# Patient Record
Sex: Male | Born: 1955 | Race: White | Hispanic: No | Marital: Married | Smoking: Never smoker
Health system: Southern US, Community
[De-identification: ages and names within clinical notes are randomized; demographics above are authoritative.]

## PROBLEM LIST (undated history)

## (undated) DIAGNOSIS — E119 Type 2 diabetes mellitus without complications: Secondary | ICD-10-CM

## (undated) DIAGNOSIS — I10 Essential (primary) hypertension: Secondary | ICD-10-CM

## (undated) DIAGNOSIS — I219 Acute myocardial infarction, unspecified: Secondary | ICD-10-CM

## (undated) HISTORY — PX: TONSILLECTOMY: SUR1361

---
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
## 1988-06-26 HISTORY — PX: BACK SURGERY: SHX140

## 1989-06-26 HISTORY — PX: BACK SURGERY: SHX140

## 1993-06-26 HISTORY — PX: BACK SURGERY: SHX140

## 1994-06-26 HISTORY — PX: KNEE ARTHROSCOPY: SUR90

## 1997-06-26 HISTORY — PX: CHOLECYSTECTOMY: SHX55

## 2008-08-26 HISTORY — PX: CORONARY ARTERY BYPASS GRAFT: SHX141

## 2008-09-19 DIAGNOSIS — I219 Acute myocardial infarction, unspecified: Secondary | ICD-10-CM

## 2008-09-19 HISTORY — DX: Acute myocardial infarction, unspecified: I21.9

## 2012-06-26 HISTORY — PX: FOOT ARTHROPLASTY: SHX1657

## 2017-02-06 NOTE — Patient Instructions (Addendum)
Stephen Gonzales  02/06/2017     @PREFPERIOPPHARMACY @   Your procedure is scheduled on  02/14/2017   Report to [facility] at  1100 A.M.  Call this number if you have problems the morning of surgery:  [phone]   Remember:  Do not eat food or drink liquids after midnight.  Take these medicines the morning of surgery with A SIP OF WATER  Norvasc, Coreg, Enalapril and Neurontin  Do not wear jewelry, make-up or nail polish.  Do not wear lotions, powders, or perfumes, or deoderant.  Do not shave 48 hours prior to surgery.  Men may shave face and neck.  Do not bring valuables to the hospital.  [facility] is not responsible for any belongings or valuables.  Contacts, dentures or bridgework may not be worn into surgery.  Leave your suitcase in the car.  After surgery it may be brought to your room.  For patients admitted to the hospital, discharge time will be determined by your treatment team.  Patients discharged the day of surgery will not be allowed to drive home.   Name and phone number of your driver:   family Special instructions:  None  Please read over the following fact sheets that you were given. Anesthesia Post-op Instructions and Care and Recovery After Surgery         Ostectomy of the Foot Ostectomy of the foot is a surgery to remove part of a bone in the foot. You may need this procedure if:  You have an abnormal bone growth called a bone spur (osteophyte) in your foot.  Bones in your foot are not lined up with each other (misalignment).  The procedure may be done if one of these conditions is causing pain or limiting movement. It is usually done when other treatments have not helped. During an ostectomy, part of the bone is removed to shorten or reshape the bone. Tell a health care provider about:  Any allergies you have.  All medicines you are taking, including vitamins, herbs, eye drops, creams, and over-the-counter medicines.  Any problems  you or family members have had with anesthetic medicines.  Any blood disorders you have.  Any surgeries you have had.  Any medical conditions you have.  Whether you are pregnant or may be pregnant. What are the risks? Generally, this is a safe procedure. However, problems may occur, including:  Infection.  Bleeding.  Pain.  Stiffness.  Numbness.  Allergic reactions to medicines.  Damage to nerves or blood vessels in the foot.  A blood clot that forms in the leg and travels to the lung.  Failure of the bone to heal.  The abnormal bone growth coming back again (recurrence).  What happens before the procedure? Staying hydrated Follow instructions from your health care provider about hydration, which may include:  Up to 2 hours before the procedure - you may continue to drink clear liquids, such as water, clear fruit juice, black coffee, and plain tea.  Eating and drinking restrictions Follow instructions from your health care provider about eating and drinking, which may include:  8 hours before the procedure - stop eating heavy meals or foods such as meat, fried foods, or fatty foods.  6 hours before the procedure - stop eating light meals or foods, such as toast or cereal.  6 hours before the procedure - stop drinking milk or drinks that contain milk.  2 hours before the procedure - stop drinking  clear liquids.  Medicines  Ask your health care provider about: ? Changing or stopping your regular medicines. This is especially important if you are taking diabetes medicines or blood thinners. ? Taking medicines such as aspirin and ibuprofen. These medicines can thin your blood. Do not take these medicines before your procedure if your health care provider instructs you not to.  You may be given antibiotic medicine to help prevent infection. General instructions  You may have foot X-rays done while you are standing on your foot. This allows the surgeon to clearly  see the abnormal bone growth in your foot.  Ask your health care provider how your surgical site will be marked or identified.  You may be asked to shower with a germ-killing soap.  Plan to have a responsible adult care for you for at least 24 hours after you leave the hospital or clinic. This is important. What happens during the procedure?  To lower your risk of infection: ? Your health care team will wash or sanitize their hands. ? Your skin will be washed with soap. ? Hair may be removed from the surgical area.  You will be given one or more of the following: ? A medicine to help you relax (sedative). ? A medicine to numb the area (local anesthetic). ? A medicine to make you fall asleep (general anesthetic). ? A medicine that is injected into your spine to numb the area below and slightly above the injection site (spinal anesthetic). ? A medicine that is injected into an area of your body to numb everything below the injection site (regional anesthetic).  An incision will be made in your foot.  Tissues, nerves, and blood vessels near the bone will be moved out of the way.  The spur or the part of bone that needs to be removed will be cut away using a bone saw or a bone shaving instrument.  The remaining bone may be secured with screws, wires, or plates.  The skin incision will be closed with stitches (sutures) or another type of skin closure.  A bandage (dressing) will be placed over the incision.  A soft wrap may be placed around your foot. The procedure may vary among health care providers and hospitals. What happens after the procedure?  Your blood pressure, heart rate, breathing rate, and blood oxygen level will be monitored until the medicines you were given have worn off.  Your foot may be placed in a protective shoe, a splint, or a cast.  You may be given crutches or a walker to help you walk without putting weight (bearing weight) on your foot.  Do not drive for  24 hours if you were given a sedative. Summary  Ostectomy of the foot is a surgery to remove part of a bone in the foot.  You may need this procedure if you have an abnormal bone growth called a bone spur (osteophyte) or if bones in your foot are not lined up with each other (misalignment). The procedure may be done if you have pain or limited movement and other treatments have not helped.  Follow instructions from your health care provider about eating and drinking before the procedure.  After the procedure, your foot may be placed in a protective shoe, a splint, or a cast. You may be given crutches or a walker to help you walk without putting weight (bearing weight) on your foot. This information is not intended to replace advice given to you by your health care  provider. Make sure you discuss any questions you have with your health care provider. Document Released: 08/01/2016 Document Revised: 08/01/2016 Document Reviewed: 08/01/2016 Elsevier Interactive Patient Education  2018 [employer].  [place] of the Foot, Care After This sheet gives you information about how to care for yourself after your procedure. Your health care provider may also give you more specific instructions. If you have problems or questions, contact your health care provider. What can I expect after the procedure? After the procedure, it is common to have:  Pain.  Swelling.  Stiffness.  Follow these instructions at home: If you have a splint or supportive shoe:  Wear the splint or shoe as told by your health care provider. Remove it only as told by your health care provider.  Loosen the splint or shoe if your toes tingle, become numb, or turn cold and blue.  Keep the splint or shoe clean.  If the splint or shoe is not waterproof: ? Do not let it get wet. ? Cover it with a watertight covering when you take a bath or a shower. If you have a cast:  Do not stick anything inside the cast to scratch your  skin. Doing that increases your risk of infection.  Check the skin around the cast every day. Tell your health care provider about any concerns.  You may put lotion on dry skin around the edges of the cast. Do not put lotion on the skin underneath the cast.  Keep the cast clean.  If the cast is not waterproof: ? Do not let it get wet. ? Cover it with a watertight covering when you take a bath or a shower. Bathing  Do not take baths, swim, or use a hot tub until your health care provider approves. Ask your health care provider if you may take showers. You may only be allowed to take sponge baths for bathing.  If your cast or splint is not waterproof, cover it with a watertight covering when you take a bath or a shower.  Keep the bandage (dressing) dry until your health care provider says it can be removed. Incision care  Follow instructions from your health care provider about how to take care of your incision. Make sure you: ? Wash your hands with soap and water before you change your bandage (dressing). If soap and water are not available, use hand sanitizer. ? Change your dressing as told by your health care provider. ? Leave stitches (sutures), skin glue, or adhesive strips in place. These skin closures may need to stay in place for 2 weeks or longer. If adhesive strip edges start to loosen and curl up, you may trim the loose edges. Do not remove adhesive strips completely unless your health care provider tells you to do that.  Check your incision area every day for signs of infection. Check for: ? Redness, swelling, or pain. ? Fluid or blood. ? Warmth. ? Pus or a bad smell. Managing pain, stiffness, and swelling  If directed, put ice on the affected area. ? If you have a removable splint or shoe, remove it as told by your health care provider. ? Put ice in a plastic bag. ? Place a towel between your skin and the bag or between your cast and the bag. ? Leave the ice on for 20  minutes, 2-3 times a day.  Move your foot and toes often to avoid stiffness and to lessen swelling.  Raise (elevate) your foot area above the level  of your heart while you are sitting or lying down. Driving  Do not drive or use heavy machinery while taking prescription pain medicine.  Ask your health care provider when it is safe to drive if you have a cast, splint, or supportive shoe on your foot. Activity  Return to your normal activities as told by your health care provider. Ask your health care provider what activities are safe for you.  Do exercises as told by your health care provider. Safety  Do not use your foot to support your body weight until your health care provider says that you can.  Use your crutches or walker as told by your health care provider. General instructions  Do not put pressure on any part of the cast or splint until it is fully hardened. This may take several hours.  Do not use any products that contain nicotine or tobacco, such as cigarettes and e-cigarettes. These can delay bone healing. If you need help quitting, ask your health care provider.  Take over-the-counter and prescription medicines only as told by your health care provider.  Keep all follow-up visits as told by your health care provider. This is important. Contact a health care provider if:  You have chills or a fever.  Your pain is not controlled by your pain medicine.  You have redness, swelling, or pain around your incision.  You have fluid or blood coming from your incision.  Your incision feels warm to the touch.  You have pus or a bad smell coming from your incision. Get help right away if:  You have severe pain.  You have new pain, warmth, and swelling in your leg.  You have chest pain or difficulty breathing. Summary  After the procedure, it is common to have some pain, swelling, and stiffness.  Follow instructions from your health care provider about how to take  care of your incision. Check the incision area every day for signs of infection.  Do not use your foot to support your body weight until your health care provider says that you can.  Move your foot and toes often to avoid stiffness and to lessen swelling. This information is not intended to replace advice given to you by your health care provider. Make sure you discuss any questions you have with your health care provider. Document Released: 08/01/2016 Document Revised: 08/01/2016 Document Reviewed: 08/01/2016 Elsevier Interactive Patient Education  2018 [employer]      Monitored Anesthesia Care Anesthesia is a term that refers to techniques, procedures, and medicines that help a person stay safe and comfortable during a medical procedure. Monitored anesthesia care, or sedation, is one type of anesthesia. Your anesthesia specialist may recommend sedation if you will be having a procedure that does not require you to be unconscious, such as:  Cataract surgery.  A dental procedure.  A biopsy.  A colonoscopy.  During the procedure, you may receive a medicine to help you relax (sedative). There are three levels of sedation:  Mild sedation. At this level, you may feel awake and relaxed. You will be able to follow directions.  Moderate sedation. At this level, you will be sleepy. You may not remember the procedure.  Deep sedation. At this level, you will be asleep. You will not remember the procedure.  The more medicine you are given, the deeper your level of sedation will be. Depending on how you respond to the procedure, the anesthesia specialist may change your level of sedation or the type of  anesthesia to fit your needs. An anesthesia specialist will monitor you closely during the procedure. Let your health care provider know about:  Any allergies you have.  All medicines you are taking, including vitamins, herbs, eye drops, creams, and over-the-counter medicines.  Any use of  steroids (by mouth or as a cream).  Any problems you or family members have had with sedatives and anesthetic medicines.  Any blood disorders you have.  Any surgeries you have had.  Any medical conditions you have, such as sleep apnea.  Whether you are pregnant or may be pregnant.  Any use of cigarettes, alcohol, or street drugs. What are the risks? Generally, this is a safe procedure. However, problems may occur, including:  Getting too much medicine (oversedation).  Nausea.  Allergic reaction to medicines.  Trouble breathing. If this happens, a breathing tube may be used to help with breathing. It will be removed when you are awake and breathing on your own.  Heart trouble.  Lung trouble.  Before the procedure Staying hydrated Follow instructions from your health care provider about hydration, which may include:  Up to 2 hours before the procedure - you may continue to drink clear liquids, such as water, clear fruit juice, black coffee, and plain tea.  Eating and drinking restrictions Follow instructions from your health care provider about eating and drinking, which may include:  8 hours before the procedure - stop eating heavy meals or foods such as meat, fried foods, or fatty foods.  6 hours before the procedure - stop eating light meals or foods, such as toast or cereal.  6 hours before the procedure - stop drinking milk or drinks that contain milk.  2 hours before the procedure - stop drinking clear liquids.  Medicines Ask your health care provider about:  Changing or stopping your regular medicines. This is especially important if you are taking diabetes medicines or blood thinners.  Taking medicines such as aspirin and ibuprofen. These medicines can thin your blood. Do not take these medicines before your procedure if your health care provider instructs you not to.  Tests and exams  You will have a physical exam.  You may have blood tests done to  show: ? How well your kidneys and liver are working. ? How well your blood can clot.  General instructions  Plan to have someone take you home from the hospital or clinic.  If you will be going home right after the procedure, plan to have someone with you for 24 hours.  What happens during the procedure?  Your blood pressure, heart rate, breathing, level of pain and overall condition will be monitored.  An IV tube will be inserted into one of your veins.  Your anesthesia specialist will give you medicines as needed to keep you comfortable during the procedure. This may mean changing the level of sedation.  The procedure will be performed. After the procedure  Your blood pressure, heart rate, breathing rate, and blood oxygen level will be monitored until the medicines you were given have worn off.  Do not drive for 24 hours if you received a sedative.  You may: ? Feel sleepy, clumsy, or nauseous. ? Feel forgetful about what happened after the procedure. ? Have a sore throat if you had a breathing tube during the procedure. ? Vomit. This information is not intended to replace advice given to you by your health care provider. Make sure you discuss any questions you have with your health care provider. Document  Released: 03/08/2005 Document Revised: 11/19/2015 Document Reviewed: 10/03/2015 Elsevier Interactive Patient Education  2018 [employer]  Monitored Anesthesia Care, Care After These instructions provide you with information about caring for yourself after your procedure. Your health care provider may also give you more specific instructions. Your treatment has been planned according to current medical practices, but problems sometimes occur. Call your health care provider if you have any problems or questions after your procedure. What can I expect after the procedure? After your procedure, it is common to:  Feel sleepy for several hours.  Feel clumsy and have poor  balance for several hours.  Feel forgetful about what happened after the procedure.  Have poor judgment for several hours.  Feel nauseous or vomit.  Have a sore throat if you had a breathing tube during the procedure.  Follow these instructions at home: For at least 24 hours after the procedure:   Do not: ? Participate in activities in which you could fall or become injured. ? Drive. ? Use heavy machinery. ? Drink alcohol. ? Take sleeping pills or medicines that cause drowsiness. ? Make important decisions or sign legal documents. ? Take care of children on your own.  Rest. Eating and drinking  Follow the diet that is recommended by your health care provider.  If you vomit, drink water, juice, or soup when you can drink without vomiting.  Make sure you have little or no nausea before eating solid foods. General instructions  Have a responsible adult stay with you until you are awake and alert.  Take over-the-counter and prescription medicines only as told by your health care provider.  If you smoke, do not smoke without supervision.  Keep all follow-up visits as told by your health care provider. This is important. Contact a health care provider if:  You keep feeling nauseous or you keep vomiting.  You feel light-headed.  You develop a rash.  You have a fever. Get help right away if:  You have trouble breathing. This information is not intended to replace advice given to you by your health care provider. Make sure you discuss any questions you have with your health care provider. Document Released: 10/03/2015 Document Revised: 02/02/2016 Document Reviewed: 10/03/2015 Elsevier Interactive Patient Education  2018 [employer].  General Anesthesia, Adult General anesthesia is the use of medicines to make a person "go to sleep" (be unconscious) for a medical procedure. General anesthesia is often recommended when a procedure:  Is long.  Requires you to be  still or in an unusual position.  Is major and can cause you to lose blood.  Is impossible to do without general anesthesia.  The medicines used for general anesthesia are called general anesthetics. In addition to making you sleep, the medicines:  Prevent pain.  Control your blood pressure.  Relax your muscles.  Tell a health care provider about:  Any allergies you have.  All medicines you are taking, including vitamins, herbs, eye drops, creams, and over-the-counter medicines.  Any problems you or family members have had with anesthetic medicines.  Types of anesthetics you have had in the past.  Any bleeding disorders you have.  Any surgeries you have had.  Any medical conditions you have.  Any history of heart or lung conditions, such as heart failure, sleep apnea, or chronic obstructive pulmonary disease (COPD).  Whether you are pregnant or may be pregnant.  Whether you use tobacco, alcohol, marijuana, or street drugs.  Any history of Financial [employer].  Any history  of depression or anxiety. What are the risks? Generally, this is a safe procedure. However, problems may occur, including:  Allergic reaction to anesthetics.  Lung and heart problems.  Inhaling food or liquids from your stomach into your lungs (aspiration).  Injury to nerves.  Waking up during your procedure and being unable to move (rare).  Extreme agitation or a state of mental confusion (delirium) when you wake up from the anesthetic.  Air in the bloodstream, which can lead to stroke.  These problems are more likely to develop if you are having a major surgery or if you have an advanced medical condition. You can prevent some of these complications by answering all of your health care provider's questions thoroughly and by following all pre-procedure instructions. General anesthesia can cause side effects, including:  Nausea or vomiting  A sore throat from the breathing tube.  Feeling  cold or shivery.  Feeling tired, washed out, or achy.  Sleepiness or drowsiness.  Confusion or agitation.  What happens before the procedure? Staying hydrated Follow instructions from your health care provider about hydration, which may include:  Up to 2 hours before the procedure - you may continue to drink clear liquids, such as water, clear fruit juice, black coffee, and plain tea.  Eating and drinking restrictions Follow instructions from your health care provider about eating and drinking, which may include:  8 hours before the procedure - stop eating heavy meals or foods such as meat, fried foods, or fatty foods.  6 hours before the procedure - stop eating light meals or foods, such as toast or cereal.  6 hours before the procedure - stop drinking milk or drinks that contain milk.  2 hours before the procedure - stop drinking clear liquids.  Medicines  Ask your health care provider about: ? Changing or stopping your regular medicines. This is especially important if you are taking diabetes medicines or blood thinners. ? Taking medicines such as aspirin and ibuprofen. These medicines can thin your blood. Do not take these medicines before your procedure if your health care provider instructs you not to. ? Taking new dietary supplements or medicines. Do not take these during the week before your procedure unless your health care provider approves them.  If you are told to take a medicine or to continue taking a medicine on the day of the procedure, take the medicine with sips of water. General instructions   Ask if you will be going home the same day, the following day, or after a longer hospital stay. ? Plan to have someone take you home. ? Plan to have someone stay with you for the first 24 hours after you leave the hospital or clinic.  For 3-6 weeks before the procedure, try not to use any tobacco products, such as cigarettes, chewing tobacco, and e-cigarettes.  You  may brush your teeth on the morning of the procedure, but make sure to spit out the toothpaste. What happens during the procedure?  You will be given anesthetics through a mask and through an IV tube in one of your veins.  You may receive medicine to help you relax (sedative).  As soon as you are asleep, a breathing tube may be used to help you breathe.  An anesthesia specialist will stay with you throughout the procedure. He or she will help keep you comfortable and safe by continuing to give you medicines and adjusting the amount of medicine that you get. He or she will also watch your  blood pressure, pulse, and oxygen levels to make sure that the anesthetics do not cause any problems.  If a breathing tube was used to help you breathe, it will be removed before you wake up. The procedure may vary among health care providers and hospitals. What happens after the procedure?  You will wake up, often slowly, after the procedure is complete, usually in a recovery area.  Your blood pressure, heart rate, breathing rate, and blood oxygen level will be monitored until the medicines you were given have worn off.  You may be given medicine to help you calm down if you feel anxious or agitated.  If you will be going home the same day, your health care provider may check to make sure you can stand, drink, and urinate.  Your health care providers will treat your pain and side effects before you go home.  Do not drive for 24 hours if you received a sedative.  You may: ? Feel nauseous and vomit. ? Have a sore throat. ? Have mental slowness. ? Feel cold or shivery. ? Feel sleepy. ? Feel tired. ? Feel sore or achy, even in parts of your body where you did not have surgery. This information is not intended to replace advice given to you by your health care provider. Make sure you discuss any questions you have with your health care provider. Document Released: 09/19/2007 Document Revised:  11/23/2015 Document Reviewed: 05/27/2015 Elsevier Interactive Patient Education  2018 [employer]. General Anesthesia, Adult, Care After These instructions provide you with information about caring for yourself after your procedure. Your health care provider may also give you more specific instructions. Your treatment has been planned according to current medical practices, but problems sometimes occur. Call your health care provider if you have any problems or questions after your procedure. What can I expect after the procedure? After the procedure, it is common to have:  Vomiting.  A sore throat.  Mental slowness.  It is common to feel:  Nauseous.  Cold or shivery.  Sleepy.  Tired.  Sore or achy, even in parts of your body where you did not have surgery.  Follow these instructions at home: For at least 24 hours after the procedure:  Do not: ? Participate in activities where you could fall or become injured. ? Drive. ? Use heavy machinery. ? Drink alcohol. ? Take sleeping pills or medicines that cause drowsiness. ? Make important decisions or sign legal documents. ? Take care of children on your own.  Rest. Eating and drinking  If you vomit, drink water, juice, or soup when you can drink without vomiting.  Drink enough fluid to keep your urine clear or pale yellow.  Make sure you have little or no nausea before eating solid foods.  Follow the diet recommended by your health care provider. General instructions  Have a responsible adult stay with you until you are awake and alert.  Return to your normal activities as told by your health care provider. Ask your health care provider what activities are safe for you.  Take over-the-counter and prescription medicines only as told by your health care provider.  If you smoke, do not smoke without supervision.  Keep all follow-up visits as told by your health care provider. This is important. Contact a health care  provider if:  You continue to have nausea or vomiting at home, and medicines are not helpful.  You cannot drink fluids or start eating again.  You cannot urinate after 8-12  hours.  You develop a skin rash.  You have fever.  You have increasing redness at the site of your procedure. Get help right away if:  You have difficulty breathing.  You have chest pain.  You have unexpected bleeding.  You feel that you are having a life-threatening or urgent problem. This information is not intended to replace advice given to you by your health care provider. Make sure you discuss any questions you have with your health care provider. Document Released: 09/18/2000 Document Revised: 11/15/2015 Document Reviewed: 05/27/2015 Elsevier Interactive Patient Education  [employer].

## 2017-02-07 ENCOUNTER — Other Ambulatory Visit: Payer: Self-pay | Admitting: Podiatry

## 2017-02-09 ENCOUNTER — Other Ambulatory Visit (HOSPITAL_COMMUNITY): Payer: Self-pay | Admitting: Podiatry

## 2017-02-09 ENCOUNTER — Ambulatory Visit (HOSPITAL_COMMUNITY)
Admission: RE | Admit: 2017-02-09 | Discharge: 2017-02-09 | Disposition: A | Discharge status: Home / Self Care | Payer: Medicare HMO | Source: Ambulatory Visit | Attending: Podiatry | Admitting: Podiatry

## 2017-02-09 ENCOUNTER — Encounter (HOSPITAL_COMMUNITY)
Admission: RE | Admit: 2017-02-09 | Discharge: 2017-02-09 | Disposition: A | Discharge status: Home / Self Care | Payer: Medicare HMO | Source: Ambulatory Visit | Attending: Podiatry | Admitting: Podiatry

## 2017-02-09 ENCOUNTER — Encounter (HOSPITAL_COMMUNITY): Payer: Self-pay

## 2017-02-09 DIAGNOSIS — L97522 Non-pressure chronic ulcer of other part of left foot with fat layer exposed: Secondary | ICD-10-CM

## 2017-02-09 DIAGNOSIS — I4510 Unspecified right bundle-branch block: Secondary | ICD-10-CM | POA: Insufficient documentation

## 2017-02-09 DIAGNOSIS — Z01818 Encounter for other preprocedural examination: Secondary | ICD-10-CM | POA: Diagnosis present

## 2017-02-09 DIAGNOSIS — M19072 Primary osteoarthritis, left ankle and foot: Secondary | ICD-10-CM | POA: Insufficient documentation

## 2017-02-09 DIAGNOSIS — R9431 Abnormal electrocardiogram [ECG] [EKG]: Secondary | ICD-10-CM | POA: Diagnosis not present

## 2017-02-09 DIAGNOSIS — M25475 Effusion, left foot: Secondary | ICD-10-CM | POA: Insufficient documentation

## 2017-02-09 DIAGNOSIS — Z9889 Other specified postprocedural states: Secondary | ICD-10-CM | POA: Insufficient documentation

## 2017-02-09 HISTORY — DX: Acute myocardial infarction, unspecified: I21.9

## 2017-02-09 HISTORY — DX: Type 2 diabetes mellitus without complications: E11.9

## 2017-02-09 HISTORY — DX: Essential (primary) hypertension: I10

## 2017-02-09 LAB — BASIC METABOLIC PANEL
ANION GAP: 12 (ref 5–15)
BUN: 27 mg/dL — ABNORMAL HIGH (ref 6–20)
CALCIUM: 9.4 mg/dL (ref 8.9–10.3)
CO2: 24 mmol/L (ref 22–32)
Chloride: 108 mmol/L (ref 101–111)
Creatinine, Ser: 2.06 mg/dL — ABNORMAL HIGH (ref 0.61–1.24)
GFR calc Af Amer: 38 mL/min — ABNORMAL LOW (ref >60)
GFR calc non Af Amer: 33 mL/min — ABNORMAL LOW (ref >60)
GLUCOSE: 174 mg/dL — AB (ref 65–99)
POTASSIUM: 3.7 mmol/L (ref 3.5–5.1)
SODIUM: 144 mmol/L (ref 135–145)

## 2017-02-09 LAB — CBC WITH DIFFERENTIAL/PLATELET
Basophils Absolute: 0 10*3/uL (ref 0.0–0.1)
Basophils Relative: 0 %
EOS PCT: 2 %
Eosinophils Absolute: 0.2 10*3/uL (ref 0.0–0.7)
HEMATOCRIT: 33.1 % — AB (ref 39.0–52.0)
HEMOGLOBIN: 11.3 g/dL — AB (ref 13.0–17.0)
LYMPHS ABS: 2.2 10*3/uL (ref 0.7–4.0)
LYMPHS PCT: 21 %
MCH: 29 pg (ref 26.0–34.0)
MCHC: 34.1 g/dL (ref 30.0–36.0)
MCV: 85.1 fL (ref 78.0–100.0)
Monocytes Absolute: 0.7 10*3/uL (ref 0.1–1.0)
Monocytes Relative: 6 %
NEUTROS ABS: 7.5 10*3/uL (ref 1.7–7.7)
Neutrophils Relative %: 71 %
PLATELETS: 256 10*3/uL (ref 150–400)
RBC: 3.89 MIL/uL — AB (ref 4.22–5.81)
RDW: 13.7 % (ref 11.5–15.5)
WBC: 10.6 10*3/uL — AB (ref 4.0–10.5)

## 2017-02-09 LAB — SURGICAL PCR SCREEN
MRSA, PCR: NEGATIVE
Staphylococcus aureus: NEGATIVE

## 2017-02-09 NOTE — Progress Notes (Signed)
Decrease Aspirin 325 mg dosage to 81 mg today.  Increase Aspirin back to 325 mg the day after the procedure per order Hedy Camara Nurse Practitioner/Gary Hyacinth Meeker MD # Royston Bake. Dr. Nolen Mu notified of change.

## 2017-02-14 ENCOUNTER — Encounter (HOSPITAL_COMMUNITY): Payer: Self-pay

## 2017-02-14 ENCOUNTER — Ambulatory Visit (HOSPITAL_COMMUNITY): Payer: Medicare HMO | Admitting: Anesthesiology

## 2017-02-14 ENCOUNTER — Encounter (HOSPITAL_COMMUNITY)
Admission: RE | Disposition: A | Discharge status: Home / Self Care | Payer: Self-pay | Source: Ambulatory Visit | Attending: Podiatry

## 2017-02-14 ENCOUNTER — Ambulatory Visit (HOSPITAL_COMMUNITY): Payer: Medicare HMO

## 2017-02-14 ENCOUNTER — Ambulatory Visit (HOSPITAL_COMMUNITY)
Admission: RE | Admit: 2017-02-14 | Discharge: 2017-02-14 | Disposition: A | Discharge status: Home / Self Care | Payer: Medicare HMO | Source: Ambulatory Visit | Attending: Podiatry | Admitting: Podiatry

## 2017-02-14 DIAGNOSIS — I252 Old myocardial infarction: Secondary | ICD-10-CM | POA: Diagnosis not present

## 2017-02-14 DIAGNOSIS — E11621 Type 2 diabetes mellitus with foot ulcer: Secondary | ICD-10-CM | POA: Insufficient documentation

## 2017-02-14 DIAGNOSIS — I10 Essential (primary) hypertension: Secondary | ICD-10-CM | POA: Diagnosis not present

## 2017-02-14 DIAGNOSIS — E1142 Type 2 diabetes mellitus with diabetic polyneuropathy: Secondary | ICD-10-CM | POA: Diagnosis not present

## 2017-02-14 DIAGNOSIS — Z951 Presence of aortocoronary bypass graft: Secondary | ICD-10-CM | POA: Insufficient documentation

## 2017-02-14 DIAGNOSIS — L97529 Non-pressure chronic ulcer of other part of left foot with unspecified severity: Secondary | ICD-10-CM | POA: Diagnosis not present

## 2017-02-14 DIAGNOSIS — D649 Anemia, unspecified: Secondary | ICD-10-CM | POA: Insufficient documentation

## 2017-02-14 DIAGNOSIS — G8929 Other chronic pain: Secondary | ICD-10-CM | POA: Insufficient documentation

## 2017-02-14 DIAGNOSIS — Z9889 Other specified postprocedural states: Secondary | ICD-10-CM

## 2017-02-14 DIAGNOSIS — L97522 Non-pressure chronic ulcer of other part of left foot with fat layer exposed: Secondary | ICD-10-CM

## 2017-02-14 HISTORY — PX: METATARSAL HEAD EXCISION: SHX5027

## 2017-02-14 LAB — GLUCOSE, CAPILLARY
GLUCOSE-CAPILLARY: 130 mg/dL — AB (ref 65–99)
GLUCOSE-CAPILLARY: 160 mg/dL — AB (ref 65–99)

## 2017-02-14 SURGERY — EXCISION, METATARSAL BONE, HEAD
Anesthesia: Monitor Anesthesia Care | Site: Fifth Toe | Laterality: Left

## 2017-02-14 MED ORDER — CHLORHEXIDINE GLUCONATE CLOTH 2 % EX PADS: 6.0000 | MEDICATED_PAD | Freq: Once | CUTANEOUS | Status: DC

## 2017-02-14 MED ORDER — BUPIVACAINE HCL (PF) 0.5 % IJ SOLN
INTRAMUSCULAR | Status: DC | PRN
Start: 2017-02-14 — End: 2017-02-14
  Administered 2017-02-14: 20 mL

## 2017-02-14 MED ORDER — CEFAZOLIN SODIUM-DEXTROSE 2-3 GM-% IV SOLR
INTRAVENOUS | Status: DC | PRN
  Administered 2017-02-14: 2 g via INTRAVENOUS

## 2017-02-14 MED ORDER — MIDAZOLAM HCL 2 MG/2ML IJ SOLN
1.0000 mg | Freq: Once | INTRAMUSCULAR | Status: AC | PRN
  Administered 2017-02-14: 2 mg via INTRAVENOUS

## 2017-02-14 MED ORDER — BUPIVACAINE HCL (PF) 0.5 % IJ SOLN
INTRAMUSCULAR | Status: AC
  Filled 2017-02-14: qty 30

## 2017-02-14 MED ORDER — SODIUM CHLORIDE 0.9 % IV SOLN
INTRAVENOUS | Status: DC
  Administered 2017-02-14: 08:00:00 via INTRAVENOUS

## 2017-02-14 MED ORDER — MIDAZOLAM HCL 2 MG/2ML IJ SOLN
INTRAMUSCULAR | Status: AC
  Filled 2017-02-14: qty 2

## 2017-02-14 MED ORDER — ONDANSETRON 4 MG PO TBDP
ORAL_TABLET | ORAL | Status: AC
  Filled 2017-02-14: qty 1

## 2017-02-14 MED ORDER — FENTANYL CITRATE (PF) 100 MCG/2ML IJ SOLN
INTRAMUSCULAR | Status: AC
  Filled 2017-02-14: qty 2

## 2017-02-14 MED ORDER — PROPOFOL 10 MG/ML IV BOLUS
INTRAVENOUS | Status: AC
  Filled 2017-02-14: qty 40

## 2017-02-14 MED ORDER — ONDANSETRON 4 MG PO TBDP
4.0000 mg | ORAL_TABLET | Freq: Once | ORAL | Status: AC
  Administered 2017-02-14: 4 mg via ORAL

## 2017-02-14 MED ORDER — FENTANYL CITRATE (PF) 100 MCG/2ML IJ SOLN
INTRAMUSCULAR | Status: DC | PRN
  Administered 2017-02-14: 25 ug via INTRAVENOUS

## 2017-02-14 MED ORDER — MIDAZOLAM HCL 5 MG/5ML IJ SOLN
INTRAMUSCULAR | Status: DC | PRN
  Administered 2017-02-14: 2 mg via INTRAVENOUS

## 2017-02-14 MED ORDER — CEFAZOLIN SODIUM-DEXTROSE 2-4 GM/100ML-% IV SOLN: 2.0000 g | INTRAVENOUS | Status: DC

## 2017-02-14 MED ORDER — 0.9 % SODIUM CHLORIDE (POUR BTL) OPTIME
TOPICAL | Status: DC | PRN
  Administered 2017-02-14: 1000 mL

## 2017-02-14 MED ORDER — PROPOFOL 500 MG/50ML IV EMUL
INTRAVENOUS | Status: DC | PRN
  Administered 2017-02-14: 50 ug/kg/min via INTRAVENOUS

## 2017-02-14 MED ORDER — CEFAZOLIN SODIUM-DEXTROSE 2-4 GM/100ML-% IV SOLN
INTRAVENOUS | Status: AC
  Filled 2017-02-14: qty 100

## 2017-02-14 SURGICAL SUPPLY — 45 items
BAG HAMPER (MISCELLANEOUS) ×2 IMPLANT
BANDAGE ELASTIC 4 VELCRO NS (GAUZE/BANDAGES/DRESSINGS) ×2 IMPLANT
BANDAGE ELASTIC 4 VELCRO ST LF (GAUZE/BANDAGES/DRESSINGS) ×2 IMPLANT
BANDAGE ESMARK 4X12 BL STRL LF (DISPOSABLE) ×1 IMPLANT
BLADE 15 SAFETY STRL DISP (BLADE) ×2 IMPLANT
BLADE AVERAGE 25X9 (BLADE) ×2 IMPLANT
BLADE OSC/SAGITTAL MD 9X18.5 (BLADE) ×2 IMPLANT
BNDG CONFORM 2 STRL LF (GAUZE/BANDAGES/DRESSINGS) ×2 IMPLANT
BNDG ESMARK 4X12 BLUE STRL LF (DISPOSABLE) ×2
BNDG GAUZE ELAST 4 BULKY (GAUZE/BANDAGES/DRESSINGS) ×2 IMPLANT
BOOT STEPPER DURA LG (SOFTGOODS) IMPLANT
BOOT STEPPER DURA MED (SOFTGOODS) IMPLANT
BOOT STEPPER DURA SM (SOFTGOODS) IMPLANT
BOOT STEPPER DURA XLG (SOFTGOODS) IMPLANT
CHLORAPREP W/TINT 26ML (MISCELLANEOUS) ×2 IMPLANT
CLOTH BEACON ORANGE TIMEOUT ST (SAFETY) ×2 IMPLANT
COVER LIGHT HANDLE STERIS (MISCELLANEOUS) ×4 IMPLANT
CUFF TOURN SGL LL 12 (TOURNIQUET CUFF) ×2 IMPLANT
DECANTER SPIKE VIAL GLASS SM (MISCELLANEOUS) ×2 IMPLANT
DRAPE OEC MINIVIEW 54X84 (DRAPES) ×2 IMPLANT
DRSG ADAPTIC 3X8 NADH LF (GAUZE/BANDAGES/DRESSINGS) ×2 IMPLANT
ELECT REM PT RETURN 9FT ADLT (ELECTROSURGICAL) ×2
ELECTRODE REM PT RTRN 9FT ADLT (ELECTROSURGICAL) ×1 IMPLANT
GAUZE SPONGE 4X4 12PLY STRL (GAUZE/BANDAGES/DRESSINGS) ×2 IMPLANT
GLOVE BIO SURGEON STRL SZ7.5 (GLOVE) ×2 IMPLANT
GLOVE BIOGEL PI IND STRL 7.0 (GLOVE) ×2 IMPLANT
GLOVE BIOGEL PI INDICATOR 7.0 (GLOVE) ×2
GOWN STRL REUS W/TWL LRG LVL3 (GOWN DISPOSABLE) ×4 IMPLANT
K-WIRE 6 (WIRE)
KIT ROOM TURNOVER APOR (KITS) ×2 IMPLANT
KWIRE 6 (WIRE) IMPLANT
MANIFOLD NEPTUNE II (INSTRUMENTS) ×2 IMPLANT
MARKER SKIN DUAL TIP RULER LAB (MISCELLANEOUS) ×2 IMPLANT
NEEDLE HYPO 27GX1-1/4 (NEEDLE) ×8 IMPLANT
NS IRRIG 1000ML POUR BTL (IV SOLUTION) ×2 IMPLANT
PACK BASIC LIMB (CUSTOM PROCEDURE TRAY) ×2 IMPLANT
PAD ARMBOARD 7.5X6 YLW CONV (MISCELLANEOUS) ×2 IMPLANT
RASP SM TEAR CROSS CUT (RASP) ×2 IMPLANT
SET BASIN LINEN APH (SET/KITS/TRAYS/PACK) ×2 IMPLANT
SUT BONE WAX W31G (SUTURE) ×2 IMPLANT
SUT MON AB 5-0 PS2 18 (SUTURE) ×2 IMPLANT
SUT PROLENE 4 0 PS 2 18 (SUTURE) ×2 IMPLANT
SUT VIC AB 4-0 PS2 27 (SUTURE) ×2 IMPLANT
SUT VICRYL AB 3-0 FS1 BRD 27IN (SUTURE) ×2 IMPLANT
SYR CONTROL 10ML LL (SYRINGE) ×4 IMPLANT

## 2017-02-14 NOTE — Anesthesia Postprocedure Evaluation (Signed)
Anesthesia Post Note  Patient: [name]  Procedure(s) Performed: Procedure(s) (LRB): 5TH METATARSAL HEAD RESECTION LEFT FOOT (Left)  Patient location during evaluation: PACU Anesthesia Type: MAC Level of consciousness: awake and alert and patient cooperative Pain management: pain level controlled Vital Signs Assessment: post-procedure vital signs reviewed and stable Respiratory status: spontaneous breathing, nonlabored ventilation and respiratory function stable Cardiovascular status: blood pressure returned to baseline Postop Assessment: adequate PO intake Anesthetic complications: no     Last Vitals:  Vitals:   02/14/17 0825 02/14/17 0830  BP: (!) 141/69 (!) 149/70  Pulse:    Resp: 15 14  Temp:    SpO2: 93% 95%    Last Pain:  Vitals:   02/14/17 0738  TempSrc: Oral                 Piers Baade J

## 2017-02-14 NOTE — Transfer of Care (Signed)
Immediate Anesthesia Transfer of Care Note  Patient: Stephen Gonzales  Procedure(s) Performed: Procedure(s): 5TH METATARSAL HEAD RESECTION LEFT FOOT (Left)  Patient Location: PACU  Anesthesia Type:MAC  Level of Consciousness: awake, oriented and patient cooperative  Airway & Oxygen Therapy: Patient Spontanous Breathing and Patient connected to face mask oxygen  Post-op Assessment: Report given to RN, Post -op Vital signs reviewed and stable and Patient moving all extremities  Post vital signs: Reviewed and stable  Last Vitals:  Vitals:   02/14/17 0825 02/14/17 0830  BP: (!) 141/69 (!) 149/70  Pulse:    Resp: 15 14  Temp:    SpO2: 93% 95%    Last Pain:  Vitals:   02/14/17 0738  TempSrc: Oral         Complications: No apparent anesthesia complications

## 2017-02-14 NOTE — Brief Op Note (Signed)
BRIEF OPERATIVE NOTE  DATE OF PROCEDURE 02/14/2017  SURGEON Dallas Schimke, DPM  ASSISTANT SURGEON None  OR STAFF Circulator: Cox, Wyatt Haste, RN Scrub Person: Jonell Cluck, CST   PREOPERATIVE DIAGNOSIS 1.  Ulceration, left foot 2.  Diabetes mellitus with peripheral neuropathy  POSTOPERATIVE DIAGNOSIS Same  PROCEDURE 5th metatarsal head resection, left foot  ANESTHESIA Monitor Anesthesia Care   HEMOSTASIS Pneumatic ankle tourniquet set at 250 mmHg  ESTIMATED BLOOD LOSS Minimal (<5 cc)  MATERIALS USED None  INJECTABLES 0.5% Marcaine plain  PATHOLOGY 5th metatarsal head, left foot   COMPLICATIONS None

## 2017-02-14 NOTE — H&P (Signed)
HISTORY AND PHYSICAL INTERVAL NOTE:  02/14/2017  8:26 AM  Stephen Gonzales  has presented today for surgery, with the diagnosis of ulceration left foot, diabetes mellitus with peripheral neuropathy.  The various methods of treatment have been discussed with the patient.  No guarantees were given.  After consideration of risks, benefits and other options for treatment, the patient has consented to surgery.  I have reviewed the patients' chart and labs.    Patient Vitals for the past 24 hrs:  Temp Temp src Pulse Resp SpO2 Height Weight  02/14/17 0738 98.5 F (36.9 C) Oral (!) 54 14 95 % 6\' 3"  (1.905 m) 274 lb (124.3 kg)    A history and physical examination was performed in my office.  The patient was reexamined.  There have been no changes to this history and physical examination.  Dallas Schimke, DPM

## 2017-02-14 NOTE — Discharge Instructions (Signed)
These instructions will give you an idea of what to expect after surgery and how to manage issues that may arise before your first post op office visit. ° °Pain Management °Pain is best managed by “staying ahead” of it. If pain gets out of control, it is difficult to get it back under control. Local anesthesia that lasts 6-8 hours is used to numb the foot and decrease pain.  For the best pain control, take the pain medication every 4 hours for the first 2 days post op. On the third day pain medication can be taken as needed.  ° °Post Op Nausea °Nausea is common after surgery, so it is managed proactively.  °If prescribed, use the prescribed nausea medication regularly for the first 2 days post op. ° °Bandages °Do not worry if there is blood on the bandage. What looks like a lot of blood on the bandage is actually a small amount. Blood on the dressing spreads out as it is absorbed by the gauze, the same way a drop of water spreads out on a paper towel.  °If the bandages feel wet or dry, stiff and uncomfortable, call the office during office hours and we will schedule a time for you to have the bandage changed.  °Unless you are specifically told otherwise, we will do the first bandage change in the office.  °Keep your bandage dry. If the bandage becomes wet or soiled, notify the office and we will schedule a time to change the bandage. ° °Activity °It is best to spend most of the first 2 days after surgery lying down with the foot elevated above the level of your heart. °You may put weight on your heel while wearing the surgical shoe.   °You may only get up to go to the restroom. ° °Driving °Do not drive until you are able to respond in an emergency (i.e. slam on the brakes). This usually occurs after the bone has healed - 6 to 8 weeks. ° °Call the Office °If you have a fever over 101°F.  °If you have increasing pain after the initial post op pain has settled down.  °If you have increasing redness, swelling, or  drainage.  °If you have any questions or concerns.  ° ° °PATIENT INSTRUCTIONS °POST-ANESTHESIA ° °IMMEDIATELY FOLLOWING SURGERY:  Do not drive or operate machinery for the first twenty four hours after surgery.  Do not make any important decisions for twenty four hours after surgery or while taking narcotic pain medications or sedatives.  If you develop intractable nausea and vomiting or a severe headache please notify your doctor immediately. ° °FOLLOW-UP:  Please make an appointment with your surgeon as instructed. You do not need to follow up with anesthesia unless specifically instructed to do so. ° °WOUND CARE INSTRUCTIONS (if applicable):  Keep a dry clean dressing on the anesthesia/puncture wound site if there is drainage.  Once the wound has quit draining you may leave it open to air.  Generally you should leave the bandage intact for twenty four hours unless there is drainage.  If the epidural site drains for more than 36-48 hours please call the anesthesia department. ° °QUESTIONS?:  Please feel free to call your physician or the hospital operator if you have any questions, and they will be happy to assist you.    ° ° ° °

## 2017-02-14 NOTE — Op Note (Signed)
OPERATIVE NOTE  DATE OF PROCEDURE 02/14/2017  SURGEON Dallas Schimke, DPM  ASSISTANT SURGEON None  ASSISTANT SURGEON: None  OR STAFF Circulator: Cox, Wyatt Haste, RN Scrub Person: Jonell Cluck, CST   PREOPERATIVE DIAGNOSIS 1.  Ulceration, left foot 2.  Diabetes mellitus with peripheral neuropathy  POSTOPERATIVE DIAGNOSIS Same  PROCEDURE 5th metatarsal head resection, left foot  ANESTHESIA Monitor Anesthesia Care   HEMOSTASIS Pneumatic ankle tourniquet set at 250 mmHg  ESTIMATED BLOOD LOSS Minimal (<5 cc)  MATERIALS USED None  INJECTABLES 0.5% Marcaine plain  PATHOLOGY  5th metatarsal head, left foot   COMPLICATIONS None  INDICATIONS:  Chronic ulceration of the left foot that has failed to resolve with local wound care and offloading.  DESCRIPTION OF THE PROCEDURE:  The patient was brought to the operating room and placed on the operative table in the supine position.  A pneumatic ankle tourniquet was applied to the operative extremity.  Following sedation, the surgical site was anesthetized with 0.5% Marcaine plain.  The foot was then prepped, scrubbed, and draped in the usual sterile technique.  The foot was elevated, exsanguinated and the pneumatic ankle tourniquet inflated to 250 mmHg.    Attention was directed to the dorsal lateral aspect of the left foot.  A surgical scar was noted consistent with his surgical history.  2 converging semielliptical incisions were made encompassing the surgical scar in its entirety.  The wedge of skin was removed and passed from the operative field.  Dissection was continued deep down to the level of the fifth metatarsal.  A linear periosteal incision was made.  The periosteum was reflected medially and laterally.  A power bone saw was used to perform an osteotomy proximal to the surgical neck.  The fifth metatarsal head was freed of all soft tissue attachments, removed and passed from the operative field.  The fifth  metatarsal head was sent to pathology for evaluation.  The surgical wound was irrigated with copious amounts of sterile irrigant.  The subcutaneous structures were reapproximated using 3-0 Vicryl in a simple suture technique.  The skin was reapproximated using 4-0 Prolene in a simple suture technique.  A sterile compressive dressing was applied to the left foot.  The pneumatic ankle tourniquet was deflated and a prompt hyperemic response was noted to all digits of the left foot.   The patient tolerated the procedure well.  The patient was then transferred to PACU with vital signs stable and vascular status intact to all toes of the operative foot.

## 2017-02-14 NOTE — Anesthesia Preprocedure Evaluation (Signed)
Anesthesia Evaluation  Patient identified by MRN, date of birth, ID band Patient awake  General Assessment Comment:Patient has had this surgery before, under sedation and local block  Reviewed: reviewed documented beta blocker date and time   Airway Mallampati: I  TM Distance: >3 FB Neck ROM: Full    Dental  (+) Teeth Intact   Pulmonary    Pulmonary exam normal breath sounds clear to auscultation       Cardiovascular Exercise Tolerance: Poor METS: 3 - Mets hypertension, Pt. on home beta blockers and Pt. on medications + Past MI (CABG in 2010, now stable)  Normal cardiovascular exam Rhythm:Regular Rate:Normal  CABG 2010 no problems with CP/SOB, has NTG available but has not needed it. [facility] in [place].     Neuro/Psych Chronic pain, up to 5 hydrocodone/day   GI/Hepatic   Endo/Other  diabetes, Poorly Controlled, Type 2  Renal/GU CRFRenal diseaseCKD, followed by primary care doctor, currently stable     Musculoskeletal   Abdominal   Peds  Hematology  (+) anemia ,   Anesthesia Other Findings ECG:  NSR with fusion complexes, RBBB  Reproductive/Obstetrics                             Anesthesia Physical Anesthesia Plan  ASA: IV  Anesthesia Plan: MAC   Post-op Pain Management:    Induction: Intravenous  PONV Risk Score and Plan:   Airway Management Planned: Mask and Natural Airway  Additional Equipment:   Intra-op Plan:   Post-operative Plan:   Informed Consent: I have reviewed the patients History and Physical, chart, labs and discussed the procedure including the risks, benefits and alternatives for the proposed anesthesia with the patient or authorized representative who has indicated his/her understanding and acceptance.   Dental advisory given  Plan Discussed with: CRNA  Anesthesia Plan Comments:         Anesthesia Quick Evaluation

## 2017-02-15 ENCOUNTER — Encounter (HOSPITAL_COMMUNITY): Payer: Self-pay | Admitting: Podiatry

## 2018-12-30 IMAGING — DX DG FOOT COMPLETE 3+V*L*
3 series · 3 of 3 positions shown · non-contrast
Comparison: None.

CLINICAL DATA: Recurrent skin ulcer along the left little toe.
Preoperative examination.

EXAM:
LEFT FOOT - COMPLETE 3+ VIEW

[foot ap]
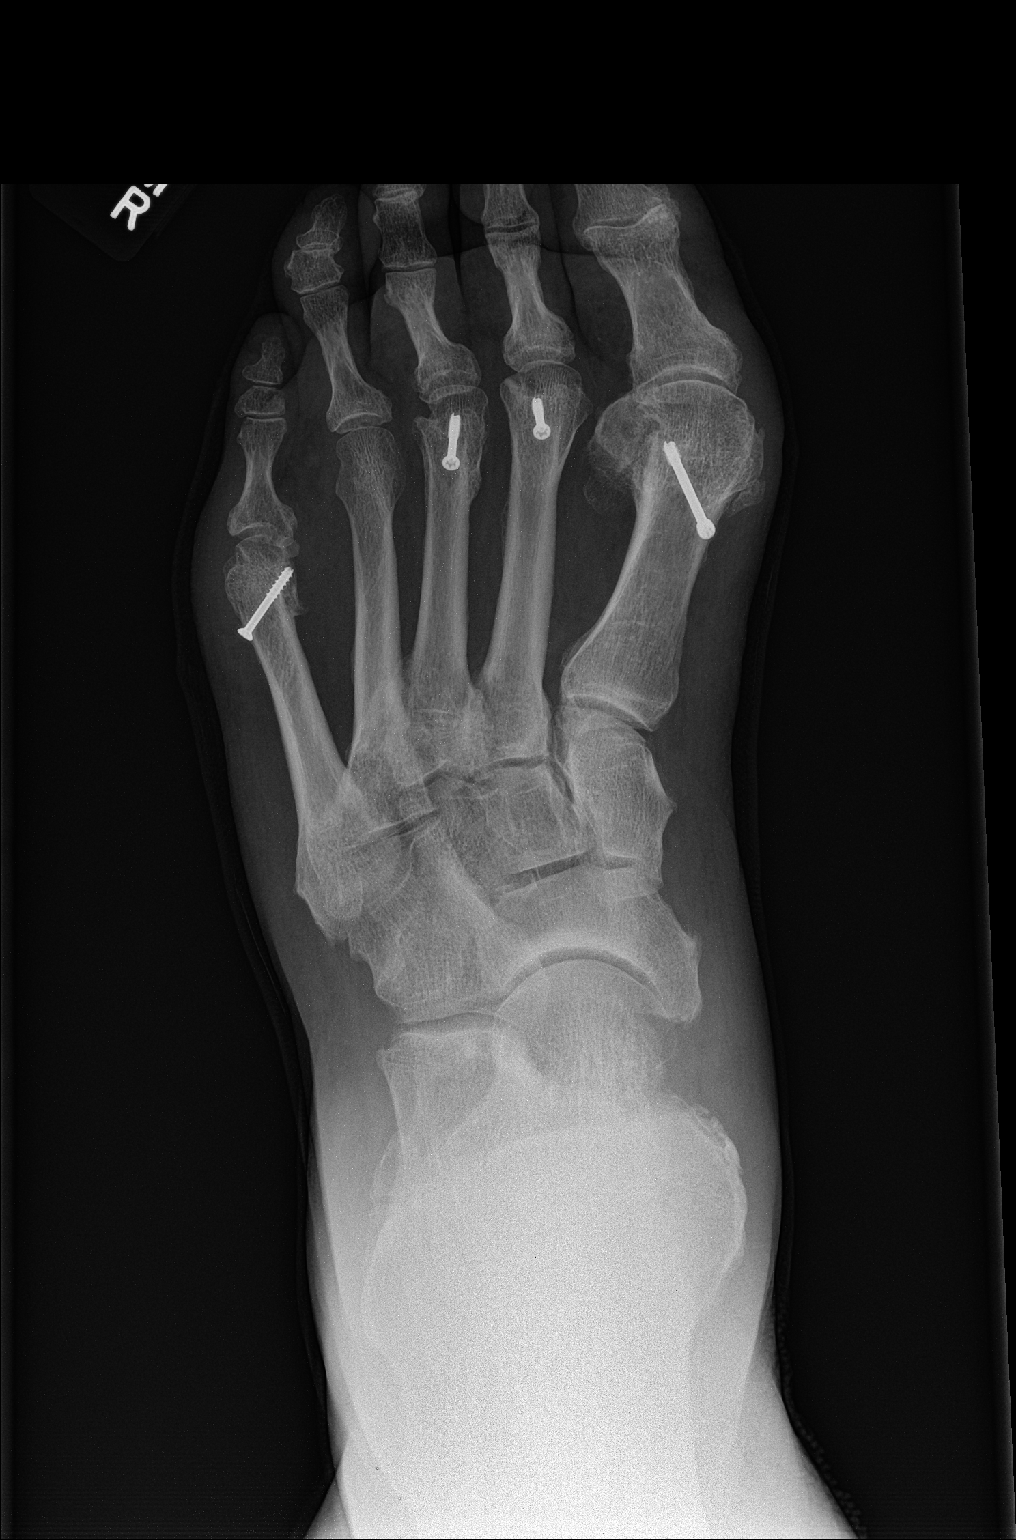

[foot obl]
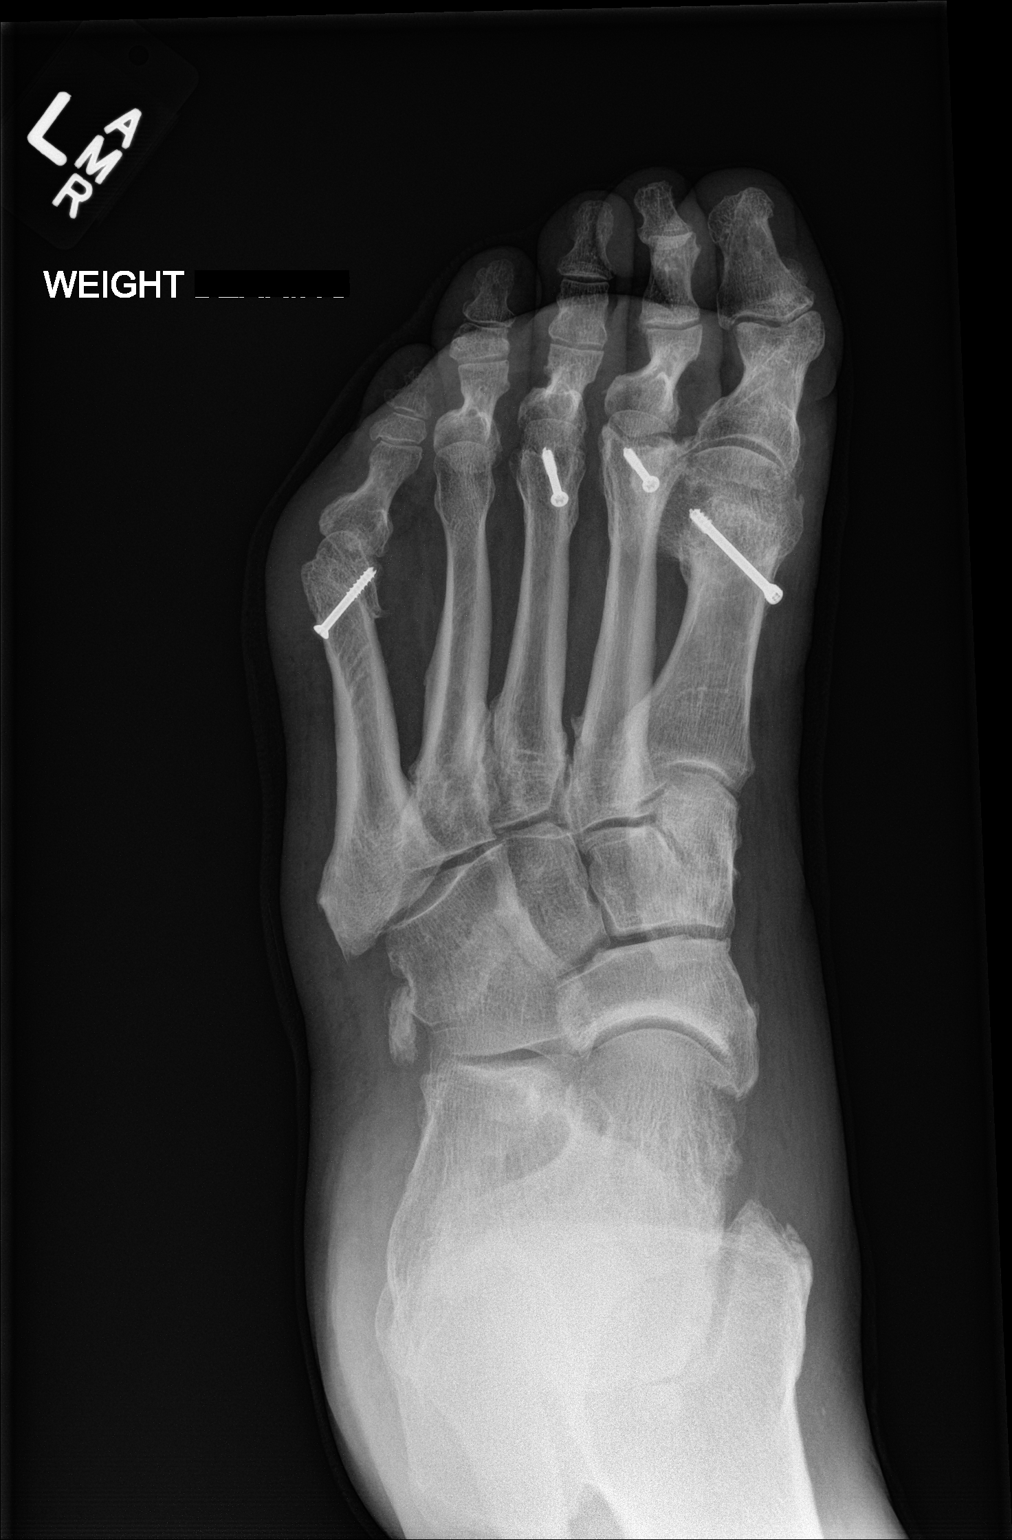

[foot lat]
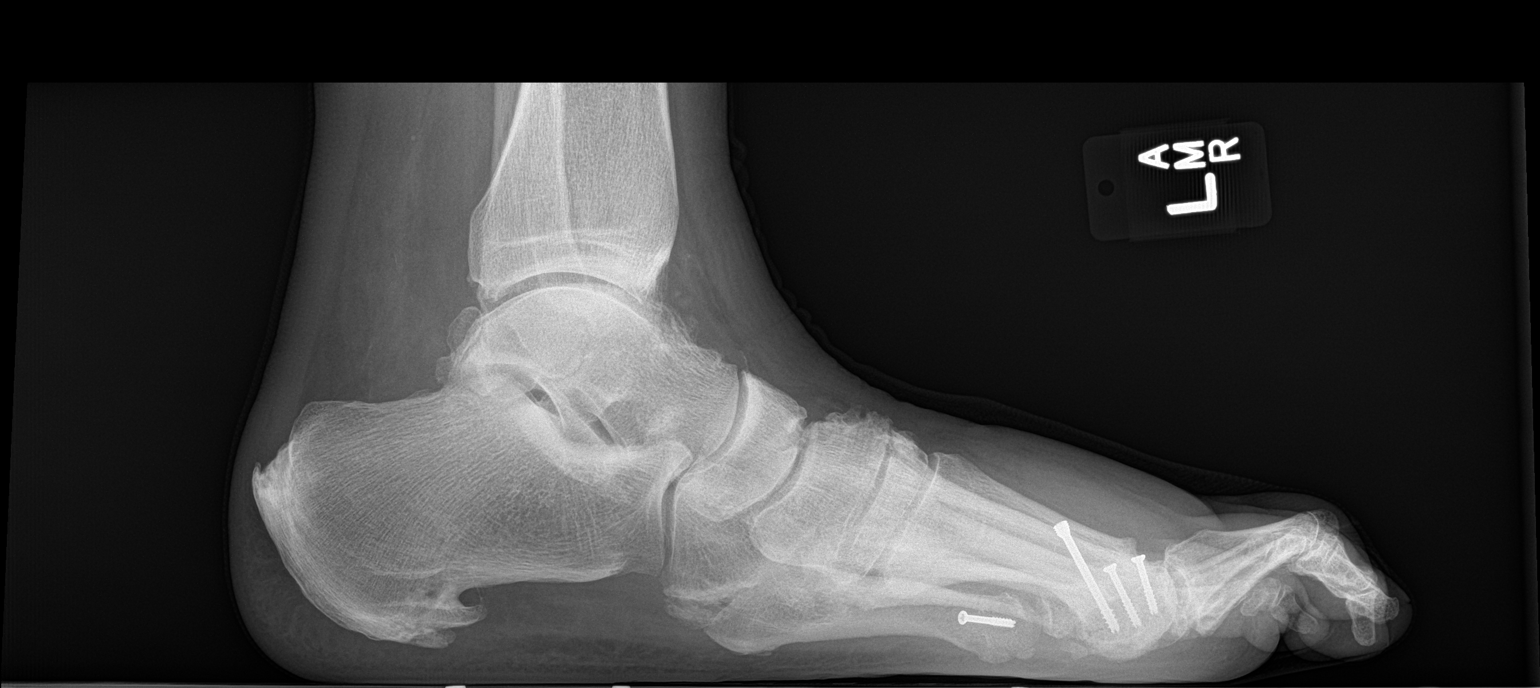

[3 of 3 positions shown; findings below may reference images not displayed]

FINDINGS: The patient's skin ulceration is not visible on this examination.
Screws are present in the distal first, second, third and fifth
metatarsals where there are healed osteotomy defects. Hallux valgus
is noted. The patient has degenerative disease of the first and
fifth MTP joints and midfoot. No bony destructive change or
periosteal reaction. Calcaneal spurring is noted. Soft tissues of
the foot appear swollen. Pes planus is identified.
IMPRESSION: Soft tissue swelling.  No plain film evidence of osteomyelitis.

Postoperative change distal first, second, third and fifth
metatarsals.

Osteoarthritis of the midfoot and first and fifth MTP joints.
